# Patient Record
Sex: Female | Born: 2005 | Race: White | Hispanic: No | Marital: Single | Smoking: Never smoker
Health system: Southern US, Community
[De-identification: ages and names within clinical notes are randomized; demographics above are authoritative.]

## PROBLEM LIST (undated history)

## (undated) DIAGNOSIS — F4310 Post-traumatic stress disorder, unspecified: Secondary | ICD-10-CM

## (undated) DIAGNOSIS — G43909 Migraine, unspecified, not intractable, without status migrainosus: Secondary | ICD-10-CM

## (undated) DIAGNOSIS — F32A Depression, unspecified: Secondary | ICD-10-CM

## (undated) DIAGNOSIS — F419 Anxiety disorder, unspecified: Secondary | ICD-10-CM

## (undated) DIAGNOSIS — F909 Attention-deficit hyperactivity disorder, unspecified type: Secondary | ICD-10-CM

## (undated) HISTORY — PX: RHINOPLASTY: SHX2354

## (undated) NOTE — Progress Notes (Signed)
Formatting of this note might be different from the original.  Lyman Bishop in office today with self. Best contact number [phone]  .    During COVID-19 pandemic any testing of patient with concern for pandemic virus was performed with full PPE.     Electronically signed by Lyndal Pulley, RN at 10/21/2020  9:42 AM EDT

## (undated) NOTE — Progress Notes (Signed)
Formatting of this note is different from the original.  Chief Complaint: f/u depression    HPI:  Patient taking zoloft for past 4 weeks, did 25 mg for 2 weeks and now at 50mg  for 2 weeks.    Patch birth control stopped because nauseated with patch and zoloft  LMP 09/24/20.  Patient not sexually active currently.      Mood is described as better and patient  Patient is seeing therapist weekly and this is going well and helpful.    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   Also taking EMT course in the spring.    Patient has own horse (boards)    ROS:   Noncontributory other than noted in HPI    PE:  Temp 98.1 F (36.7 C) (Temporal)   Wt 137 lb 12.8 oz (62.5 kg)   LMP 09/30/2020 (Exact Date)    Alert, oriented  HEENT:  Clear  Lungs:  Clear  CV:  RRR without murmer  Abd:  Soft, nontender, nondistended, +BS  Ext:  FROM  Skin:  Clear  PHQ9     Depression Screen 10/21/2020 09/22/2020    Please choose the category that best describes the patient's current state 1 0    Not eligible on the basis of Not applicable Not applicable    1. Little interest or pleasure in doing things 1 2    2. Feeling down, depressed, or hopeless 0 2    PHQ-2 Total Score 1 4    PHQ-2 Interpretation of Score for Depression (Payor) Negative -    3. Trouble falling or staying asleep 0 0    4. Feeling tired or having little energy 1 3    5. Poor appetite or overeating 2 2    6. Feeling bad about yourself - or that you are a failure or have let yourself or your family down 1 3    7. Trouble concentrating on things, such as reading the newspaper or watching television 2 3    8. Moving or speaking so slowly that other people could have noticed.  Or the opposite - being so fidgety or restless that you have been moving around a lot more than usual. 1 2    9. Thoughts that you would be better off dead, or of hurting yourself in some way. 0 0    10. How difficult  have these problems made it for you to do your work, take care of things at home or get along with other people? Somewhat difficult Extremely difficult    PHQ Total Score 8 17    Interpretation: Mild Depression, repeat at follow-up Moderately Severe Depression, immediate pharmacotherapy and/or counseling recommended    PHQ-9 Interpretation of Score for Depression (Payor) Negative Positive    Type of intervention recommended: - Referral to Psychiatry/Therapy/Counseling;Counseling at today's visit;Patient prescribed or currently receiving intervention (treatment/counseling)           Assessment:  Encounter Diagnosis   Name Primary?   ? PTSD (post-traumatic stress disorder)        Plan: Depression symptoms better and will cont zoloft 50mg  and therapy.  Psychiatry number given to patient so she could f/u and make appt.  F/u depression in 3-4 months  Hold on OCP now per patient's desires.  Discussed options for future.    No orders of the defined types were placed in this encounter.  Electronically signed by Carlean Jews, MD at 10/21/2020  9:42 AM EDT

---
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
## 2023-05-19 ENCOUNTER — Emergency Department

## 2023-05-19 ENCOUNTER — Other Ambulatory Visit: Payer: Self-pay

## 2023-05-19 ENCOUNTER — Emergency Department
Admission: EM | Admit: 2023-05-19 | Discharge: 2023-05-19 | Disposition: A | Discharge status: Home / Self Care | Attending: Emergency Medicine | Admitting: Emergency Medicine

## 2023-05-19 DIAGNOSIS — M5442 Lumbago with sciatica, left side: Secondary | ICD-10-CM | POA: Insufficient documentation

## 2023-05-19 DIAGNOSIS — M25552 Pain in left hip: Secondary | ICD-10-CM | POA: Insufficient documentation

## 2023-05-19 DIAGNOSIS — M549 Dorsalgia, unspecified: Secondary | ICD-10-CM | POA: Diagnosis not present

## 2023-05-19 HISTORY — DX: Anxiety disorder, unspecified: F41.9

## 2023-05-19 HISTORY — DX: Post-traumatic stress disorder, unspecified: F43.10

## 2023-05-19 HISTORY — DX: Attention-deficit hyperactivity disorder, unspecified type: F90.9

## 2023-05-19 MED ORDER — OXYCODONE-ACETAMINOPHEN 5-325 MG PO TABS: 1.0000 | ORAL_TABLET | ORAL | 0 refills | Status: AC | PRN

## 2023-05-19 MED ORDER — CYCLOBENZAPRINE HCL 10 MG PO TABS: 10.0000 mg | ORAL_TABLET | Freq: Three times a day (TID) | ORAL | 0 refills | Status: AC | PRN

## 2023-05-19 MED ORDER — OXYCODONE-ACETAMINOPHEN 5-325 MG PO TABS
1.0000 | ORAL_TABLET | Freq: Once | ORAL | Status: AC
  Administered 2023-05-19: 1 via ORAL
  Filled 2023-05-19: qty 1

## 2023-05-19 MED ORDER — PREDNISONE 10 MG (48) PO TBPK
ORAL_TABLET | ORAL | 0 refills | Status: AC
Start: 2023-05-19 — End: ?

## 2023-05-19 NOTE — ED Triage Notes (Addendum)
 Patient arrives POV c/o left hip pain that started Friday night; patient reports going to her PCP on Saturday and was prescribed a 6-day taper of prednisone with today being the last dose. 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 Patient reports father has hx of spinal AVM.

## 2023-05-19 NOTE — Discharge Instructions (Signed)
 Use ice to lower back.  Do not use heat.  Take medication as prescribed.  The Flexeril and the oxycodone will both make you drowsy.  Do not operate machinery while using these medications.  Follow-up with orthopedics.  You may need physical therapy.  Return if worsening.

## 2023-05-19 NOTE — ED Provider Triage Note (Signed)
 Emergency Medicine Provider Triage Evaluation Note  Karen Reilly , a 18 y.o. female  was evaluated in triage.  Pt complains of left hip and lower back pain.  Is on a steroid taper which is not helping.  Patient rides horses.  Review of Systems  Positive:  Negative:   Physical Exam  BP 117/79 (BP Location: Left Arm)   Pulse 75   Temp 97.6 F (36.4 C) (Oral)   Resp 19   Ht 5\' 5"  (1.651 m)   Wt 65.8 kg   LMP 05/05/2023 (Exact Date)   SpO2 99%   BMI 24.13 kg/m  Gen:   Awake, no distress   Resp:  Normal effort  MSK:   Moves extremities without difficulty  Other:  Tender at the lumbar spine around L5 L4, tender at the left trochanter bursa  Medical Decision Making  Medically screening exam initiated at 12:42 PM.  Appropriate orders placed.  Marzell Allemand was informed that the remainder of the evaluation will be completed by another provider, this initial triage assessment does not replace that evaluation, and the importance of remaining in the ED until their evaluation is complete.     Faythe Ghee, PA-C 05/19/23 1243

## 2023-05-19 NOTE — ED Notes (Signed)
 See triage notes. Patient c/o left hip/leg pain. Patient was seen at their PCP and was prescribed medications for sciatica. Patient stated pain has been going on since last Friday.

## 2023-05-19 NOTE — ED Provider Notes (Signed)
 [facility] Provider Note    Event Date/Time   First MD Initiated Contact with Patient 05/19/23 1549     (approximate)   History   Hip Pain   HPI  Karen Reilly is a 18 y.o. female with complaints of left hip and lower back pain presents emergency department stating that she was seen by her regular doctor and given a 6-day prednisone taper.  Was feeling better on the first 2 days but pain has returned.  Radiates down the left leg.  Patient does ride horses and works with animals.  Has not had a fall or injury that she knows of.  Patient last rode the horse yesterday.  Father has history of spinal AVM      Physical Exam   Triage Vital Signs: ED Triage Vitals  Encounter Vitals Group     BP 05/19/23 1241 117/79     Systolic BP Percentile --      Diastolic BP Percentile --      Pulse Rate 05/19/23 1241 75     Resp 05/19/23 1241 19     Temp 05/19/23 1241 97.6 F (36.4 C)     Temp Source 05/19/23 1241 Oral     SpO2 05/19/23 1241 99 %     Weight 05/19/23 1234 145 lb (65.8 kg)     Height 05/19/23 1234 5\' 5"  (1.651 m)     Head Circumference --      Peak Flow --      Pain Score 05/19/23 1232 7     Pain Loc --      Pain Education --      Exclude from Growth Chart --     Most recent vital signs: Vitals:   05/19/23 1241  BP: 117/79  Pulse: 75  Resp: 19  Temp: 97.6 F (36.4 C)  SpO2: 99%     General: Awake, no distress.   CV:  Good peripheral perfusion. regular rate and  rhythm Resp:  Normal effort.  Abd:  No distention.   Other:  Lumbar spine tender to palpation, left hip tender along the trochanteric bursa, full range of motion, patient is able to walk slowly but without difficulty, no foot drop noted   ED Results / Procedures / Treatments   Labs (all labs ordered are listed, but only abnormal results are displayed) Labs Reviewed - No data to display   EKG     RADIOLOGY X-ray lumbar spine x-ray left  hip    PROCEDURES:   Procedures Chief Complaint  Patient presents with   Hip Pain      MEDICATIONS ORDERED IN ED: Medications  oxyCODONE-acetaminophen (PERCOCET/ROXICET) 5-325 MG per tablet 1 tablet (1 tablet Oral Given 05/19/23 1616)     IMPRESSION / MDM / ASSESSMENT AND PLAN / ED COURSE  I reviewed the triage vital signs and the nursing notes.                              Differential diagnosis includes, but is not limited to, fracture, contusion, sprain  Patient's presentation is most consistent with acute illness / injury with system symptoms.   X-ray of lumbar spine and left hip independently reviewed and interpreted by me as being negative for any acute abnormality.    Patient was given Percocet p.o. here in the ED.  To be given a 12-day steroid taper.  Flexeril muscle relaxer.  I did instruct her not to  ride horses until evaluated by orthopedics.  Since the pain radiates to both hips I was concerned about her back.  Explained to her she may end up needing physical therapy etc.  She is in agreement treatment plan.  Discharged stable condition in the care of her family member      FINAL CLINICAL IMPRESSION(S) / ED DIAGNOSES   Final diagnoses:  Left hip pain  Acute midline low back pain with left-sided sciatica     Rx / DC Orders   ED Discharge Orders          Ordered    predniSONE (STERAPRED UNI-PAK 48 TAB) 10 MG (48) TBPK tablet        05/19/23 1608    cyclobenzaprine (FLEXERIL) 10 MG tablet  3 times daily PRN        05/19/23 1608    oxyCODONE-acetaminophen (PERCOCET) 5-325 MG tablet  Every 4 hours PRN        05/19/23 1608             Note:  This document was prepared using Dragon voice recognition software and may include unintentional dictation errors.    Faythe Ghee, PA-C 05/19/23 1827    Claybon Jabs, MD 05/19/23 [phone]

## 2023-05-20 DIAGNOSIS — M5416 Radiculopathy, lumbar region: Secondary | ICD-10-CM | POA: Diagnosis not present

## 2023-05-23 DIAGNOSIS — M9902 Segmental and somatic dysfunction of thoracic region: Secondary | ICD-10-CM | POA: Diagnosis not present

## 2023-05-23 DIAGNOSIS — M9901 Segmental and somatic dysfunction of cervical region: Secondary | ICD-10-CM | POA: Diagnosis not present

## 2023-05-23 DIAGNOSIS — M5414 Radiculopathy, thoracic region: Secondary | ICD-10-CM | POA: Diagnosis not present

## 2023-05-23 DIAGNOSIS — R519 Headache, unspecified: Secondary | ICD-10-CM | POA: Diagnosis not present

## 2023-05-24 ENCOUNTER — Other Ambulatory Visit: Payer: Self-pay | Admitting: Emergency Medicine

## 2023-05-24 DIAGNOSIS — M549 Dorsalgia, unspecified: Secondary | ICD-10-CM

## 2023-05-26 ENCOUNTER — Encounter: Payer: Self-pay | Admitting: Emergency Medicine

## 2023-05-27 DIAGNOSIS — M9901 Segmental and somatic dysfunction of cervical region: Secondary | ICD-10-CM | POA: Diagnosis not present

## 2023-05-27 DIAGNOSIS — M5414 Radiculopathy, thoracic region: Secondary | ICD-10-CM | POA: Diagnosis not present

## 2023-05-27 DIAGNOSIS — M9902 Segmental and somatic dysfunction of thoracic region: Secondary | ICD-10-CM | POA: Diagnosis not present

## 2023-05-27 DIAGNOSIS — R519 Headache, unspecified: Secondary | ICD-10-CM | POA: Diagnosis not present

## 2023-05-28 ENCOUNTER — Other Ambulatory Visit

## 2023-06-06 ENCOUNTER — Other Ambulatory Visit: Payer: Self-pay

## 2023-06-06 ENCOUNTER — Emergency Department
Admission: EM | Admit: 2023-06-06 | Discharge: 2023-06-07 | Disposition: A | Discharge status: Home / Self Care | Attending: Emergency Medicine | Admitting: Emergency Medicine

## 2023-06-06 DIAGNOSIS — R112 Nausea with vomiting, unspecified: Secondary | ICD-10-CM | POA: Diagnosis not present

## 2023-06-06 DIAGNOSIS — R110 Nausea: Secondary | ICD-10-CM | POA: Diagnosis not present

## 2023-06-06 DIAGNOSIS — E871 Hypo-osmolality and hyponatremia: Secondary | ICD-10-CM | POA: Diagnosis not present

## 2023-06-06 DIAGNOSIS — R519 Headache, unspecified: Secondary | ICD-10-CM | POA: Diagnosis not present

## 2023-06-06 LAB — URINALYSIS, ROUTINE W REFLEX MICROSCOPIC
Bilirubin Urine: NEGATIVE
Glucose, UA: NEGATIVE mg/dL
Hgb urine dipstick: NEGATIVE
Ketones, ur: NEGATIVE mg/dL
Leukocytes,Ua: NEGATIVE
Nitrite: NEGATIVE
Protein, ur: NEGATIVE mg/dL
Specific Gravity, Urine: 1.016 (ref 1.005–1.030)
pH: 6 (ref 5.0–8.0)

## 2023-06-06 LAB — CBC WITH DIFFERENTIAL/PLATELET
Abs Immature Granulocytes: 0.04 10*3/uL (ref 0.00–0.07)
Basophils Absolute: 0.1 10*3/uL (ref 0.0–0.1)
Basophils Relative: 1 %
Eosinophils Absolute: 0.2 10*3/uL (ref 0.0–0.5)
Eosinophils Relative: 2 %
HCT: 39.4 % (ref 36.0–46.0)
Hemoglobin: 13.6 g/dL (ref 12.0–15.0)
Immature Granulocytes: 0 %
Lymphocytes Relative: 19 %
Lymphs Abs: 2 10*3/uL (ref 0.7–4.0)
MCH: 30.7 pg (ref 26.0–34.0)
MCHC: 34.5 g/dL (ref 30.0–36.0)
MCV: 88.9 fL (ref 80.0–100.0)
Monocytes Absolute: 0.7 10*3/uL (ref 0.1–1.0)
Monocytes Relative: 7 %
Neutro Abs: 7.3 10*3/uL (ref 1.7–7.7)
Neutrophils Relative %: 71 %
Platelets: 319 10*3/uL (ref 150–400)
RBC: 4.43 MIL/uL (ref 3.87–5.11)
RDW: 12.2 % (ref 11.5–15.5)
WBC: 10.3 10*3/uL (ref 4.0–10.5)
nRBC: 0 % (ref 0.0–0.2)

## 2023-06-06 LAB — BASIC METABOLIC PANEL WITH GFR
Anion gap: 11 (ref 5–15)
BUN: 12 mg/dL (ref 6–20)
CO2: 23 mmol/L (ref 22–32)
Calcium: 9 mg/dL (ref 8.9–10.3)
Chloride: 99 mmol/L (ref 98–111)
Creatinine, Ser: 0.82 mg/dL (ref 0.44–1.00)
GFR, Estimated: >60 mL/min (ref >60)
Glucose, Bld: 105 mg/dL — ABNORMAL HIGH (ref 70–99)
Potassium: 3.6 mmol/L (ref 3.5–5.1)
Sodium: 133 mmol/L — ABNORMAL LOW (ref 135–145)

## 2023-06-06 LAB — POC URINE PREG, ED: Preg Test, Ur: NEGATIVE

## 2023-06-06 MED ORDER — ONDANSETRON 4 MG PO TBDP: 4.0000 mg | ORAL_TABLET | Freq: Three times a day (TID) | ORAL | 0 refills | Status: AC | PRN

## 2023-06-06 MED ORDER — KETOROLAC TROMETHAMINE 30 MG/ML IJ SOLN
30.0000 mg | Freq: Once | INTRAMUSCULAR | Status: AC
  Administered 2023-06-07: 30 mg via INTRAVENOUS
  Filled 2023-06-06: qty 1

## 2023-06-06 MED ORDER — ONDANSETRON HCL 4 MG/2ML IJ SOLN
4.0000 mg | Freq: Once | INTRAMUSCULAR | Status: AC
  Administered 2023-06-06: 4 mg via INTRAVENOUS
  Filled 2023-06-06: qty 2

## 2023-06-06 MED ORDER — SODIUM CHLORIDE 0.9 % IV BOLUS
500.0000 mL | Freq: Once | INTRAVENOUS | Status: AC
  Administered 2023-06-06: 500 mL via INTRAVENOUS

## 2023-06-06 MED ORDER — LIDOCAINE-EPINEPHRINE-TETRACAINE (LET) TOPICAL GEL
3.0000 mL | Freq: Once | TOPICAL | Status: AC
  Administered 2023-06-06: 3 mL via TOPICAL
  Filled 2023-06-06: qty 3

## 2023-06-06 NOTE — ED Triage Notes (Signed)
 Pt states she developed a headache earlier today and developed some nausea. Pt denies hx of headaches. Denies numbness or weakness in extremities, speech clear no vision changes.

## 2023-06-06 NOTE — ED Notes (Signed)
 First Nurse Note: Pt arrives via [facility] from work with complaints of headache x 2 hours with associated nausea. Took Tylenol  without relief.   VS with EMS 85 100% RA 122/67 CBG-103 98.2 temp

## 2023-06-06 NOTE — Discharge Instructions (Addendum)
 You have been diagnosed with mild hyponatremia, headache and nauseas.  Please take ondansetron  20 minutes before main meals.  You can take acetaminophen  for headache every 6 hours.  Please call neurology and make an appointment tomorrow.  Come back to ED or go to your PCP if you have new symptoms symptoms worsen.  Please drink plenty of fluids, please drink electrolytes like liquid IV.

## 2023-06-06 NOTE — ED Provider Notes (Signed)
 [facility] Provider Note    Event Date/Time   First MD Initiated Contact with Patient 06/06/23 2151     (approximate)   History   Headache    HPI  Karen Reilly is a 18 y.o. female   with no significant past medical history who presents to the ED complaining of nauseas, headache,    According to the patient she was seen 3 weeks ago after being unable to walk.  PCP ordered in ivermectin, and she was able to walk again.  Patient denies fever, chills, cough, nasal congestion, urinary symptoms.       Physical Exam   Triage Vital Signs: ED Triage Vitals  Encounter Vitals Group     BP 06/06/23 1921 126/83     Systolic BP Percentile --      Diastolic BP Percentile --      Pulse Rate 06/06/23 1921 84     Resp 06/06/23 1921 18     Temp 06/06/23 1921 98.5 F (36.9 C)     Temp src --      SpO2 06/06/23 1921 100 %     Weight 06/06/23 1922 140 lb (63.5 kg)     Height 06/06/23 1922 5\' 5"  (1.651 m)     Head Circumference --      Peak Flow --      Pain Score 06/06/23 1922 7     Pain Loc --      Pain Education --      Exclude from Growth Chart --     Most recent vital signs: Vitals:   06/06/23 1921  BP: 126/83  Pulse: 84  Resp: 18  Temp: 98.5 F (36.9 C)  SpO2: 100%     Constitutional: Alert, NAD. Able to speak in complete sentences without cough or dyspnea  Eyes: Conjunctivae are normal.  Head: Atraumatic. Nose: No congestion/rhinnorhea. Mouth/Throat: Mucous membranes are moist.   Neck: Painless ROM. Supple. No JVD, nodes, thyromegaly.  Patient states neck pain when asked to touch her thorax with her chin Cardiovascular:   Good peripheral circulation.RRR no murmurs, gallops, rubs  Respiratory: Normal respiratory effort.  No retractions. Clear to auscultation bilaterally without wheezing or crackles  Gastrointestinal: Soft and nontender.  Musculoskeletal:  no deformity Neurologic:  MAE spontaneously. No gross focal neurologic deficits are  appreciated.  Babinski negative Skin:  Skin is warm, dry and intact. No rash noted. Psychiatric: Mood and affect are normal. Speech and behavior are normal.    ED Results / Procedures / Treatments   Labs (all labs ordered are listed, but only abnormal results are displayed) Labs Reviewed  BASIC METABOLIC PANEL WITH GFR - Abnormal; Notable for the following components:      Result Value   Sodium 133 (*)    Glucose, Bld 105 (*)    All other components within normal limits  URINALYSIS, ROUTINE W REFLEX MICROSCOPIC - Abnormal; Notable for the following components:   Color, Urine YELLOW (*)    APPearance HAZY (*)    All other components within normal limits  POC URINE PREG, ED - Normal  CBC WITH DIFFERENTIAL/PLATELET     EKG     RADIOLOGY    PROCEDURES:  Critical Care performed:   Procedures   MEDICATIONS ORDERED IN ED: Medications  sodium chloride  0.9 % bolus 500 mL (has no administration in time range)  lidocaine -EPINEPHrine -tetracaine  (LET) topical gel (has no administration in time range)  ketorolac  (TORADOL ) 30 MG/ML injection 30 mg (has no  administration in time range)  ondansetron  (ZOFRAN ) injection 4 mg (4 mg Intravenous Given 06/06/23 2340)   Clinical Course as of 06/06/23 2341  Mon Jun 06, 2023  2224 Urinalysis, Routine w reflex microscopic -Urine, Clean Catch(!) Within normal limits [AE]  2224 Basic metabolic panel(!) Mild hyponatremia, renal function within normal limits.  [AE]  2225 CBC with Differential White blood cells hemoglobin and platelets within normal limits [AE]  2225 POC Urine Pregnancy, ED Negative [AE]    Clinical Course User Index [AE] Awilda Lennox, PA-C    IMPRESSION / MDM / ASSESSMENT AND PLAN / ED COURSE  I reviewed the triage vital signs and the nursing notes.  Differential diagnosis includes, but is not limited to, dehydration, viral infection, hyponatremia, UTI, pregnancy  Patient's presentation is most consistent with  acute complicated illness / injury requiring diagnostic workup.   Patient's diagnosis is consistent with hyponatremia.  Labs are  reassuring ruling out pregnancy, UTI. I did review the patient's allergies and medications.During admission patient received 0.9% saline solution 500 mL, Zofran , Toradol .  The patient is in stable and satisfactory condition for discharge home  Patient will be discharged home with prescriptions for ondansetron .  I advised patients to take oral electrolytes. Patient is to follow up with neurology as needed or otherwise directed. Patient is given ED precautions to return to the ED for any worsening or new symptoms. Discussed plan of care with patient, answered all of patient's questions, Patient agreeable to plan of care. Advised patient to take medications according to the instructions on the label. Discussed possible side effects of new medications. Patient verbalized understanding.    FINAL CLINICAL IMPRESSION(S) / ED DIAGNOSES   Final diagnoses:  Bad headache  Hyponatremia  Nausea     Rx / DC Orders   ED Discharge Orders          Ordered    ondansetron  (ZOFRAN -ODT) 4 MG disintegrating tablet  Every 8 hours PRN        06/06/23 2336             Note:  This document was prepared using Dragon voice recognition software and may include unintentional dictation errors.   Awilda Lennox, PA-C 06/06/23 2341    Twilla Galea, MD 06/09/23 [phone]

## 2023-06-06 NOTE — ED Notes (Signed)
 Pt states that she is having a severe headache describes same as a" ruber band wrapped around her head". Pt states she has a hx of migraines but same has not happed in many years and this pain feels different.

## 2023-06-09 DIAGNOSIS — M5416 Radiculopathy, lumbar region: Secondary | ICD-10-CM | POA: Diagnosis not present

## 2023-06-14 ENCOUNTER — Encounter: Payer: Self-pay | Admitting: Neurology

## 2023-06-14 ENCOUNTER — Telehealth: Payer: Self-pay | Admitting: Neurology

## 2023-06-14 ENCOUNTER — Ambulatory Visit: Admitting: Neurology

## 2023-06-14 NOTE — Telephone Encounter (Signed)
 NS for new pt visit.

## 2023-06-21 DIAGNOSIS — M5416 Radiculopathy, lumbar region: Secondary | ICD-10-CM | POA: Diagnosis not present

## 2023-06-23 DIAGNOSIS — M5416 Radiculopathy, lumbar region: Secondary | ICD-10-CM | POA: Diagnosis not present

## 2023-06-29 DIAGNOSIS — M5416 Radiculopathy, lumbar region: Secondary | ICD-10-CM | POA: Diagnosis not present

## 2023-06-30 ENCOUNTER — Institutional Professional Consult (permissible substitution): Admitting: Neurology

## 2023-06-30 ENCOUNTER — Telehealth: Payer: Self-pay | Admitting: Neurology

## 2023-06-30 NOTE — Telephone Encounter (Signed)
 [facility] cancellation

## 2023-07-04 IMAGING — CR DG FOOT COMPLETE 3+V*L*
1 series · 3 of 3 positions shown · non-contrast
Comparison: None.

CLINICAL DATA: Left foot pain.

EXAM:
LEFT FOOT - COMPLETE 3+ VIEW; LEFT ANKLE COMPLETE - 3+ VIEW

[Series 1: dg foot complete left · 0.14mm/px · 3 of 3 slices shown]
[im 1/3]
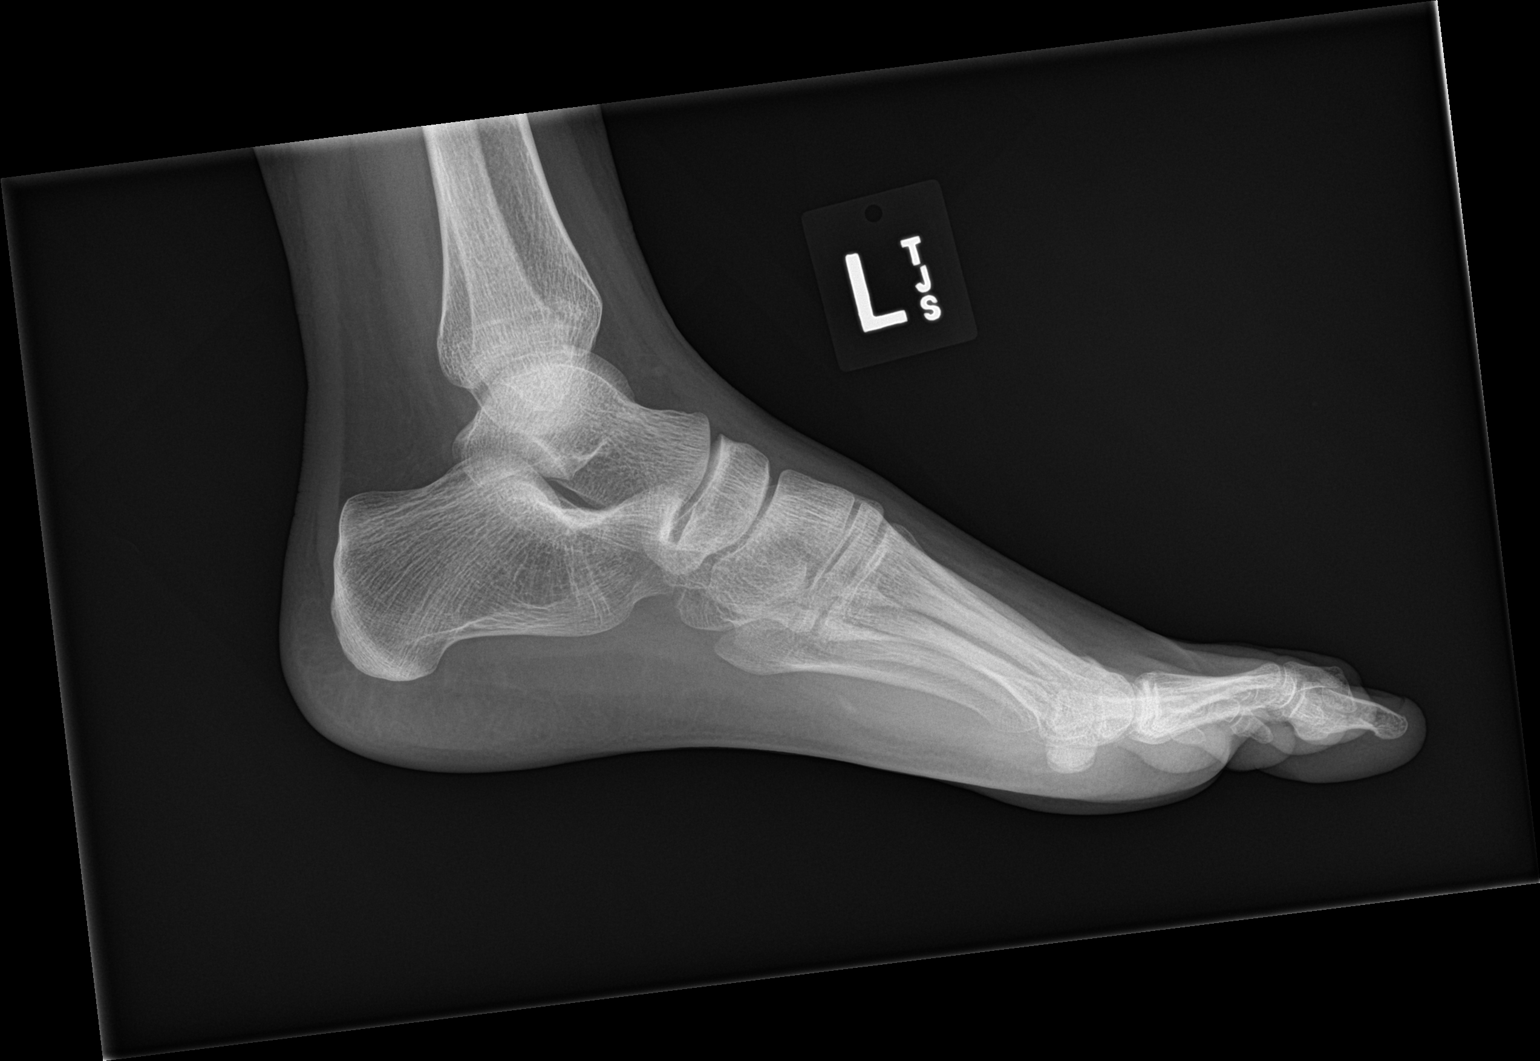
[im 2/3]
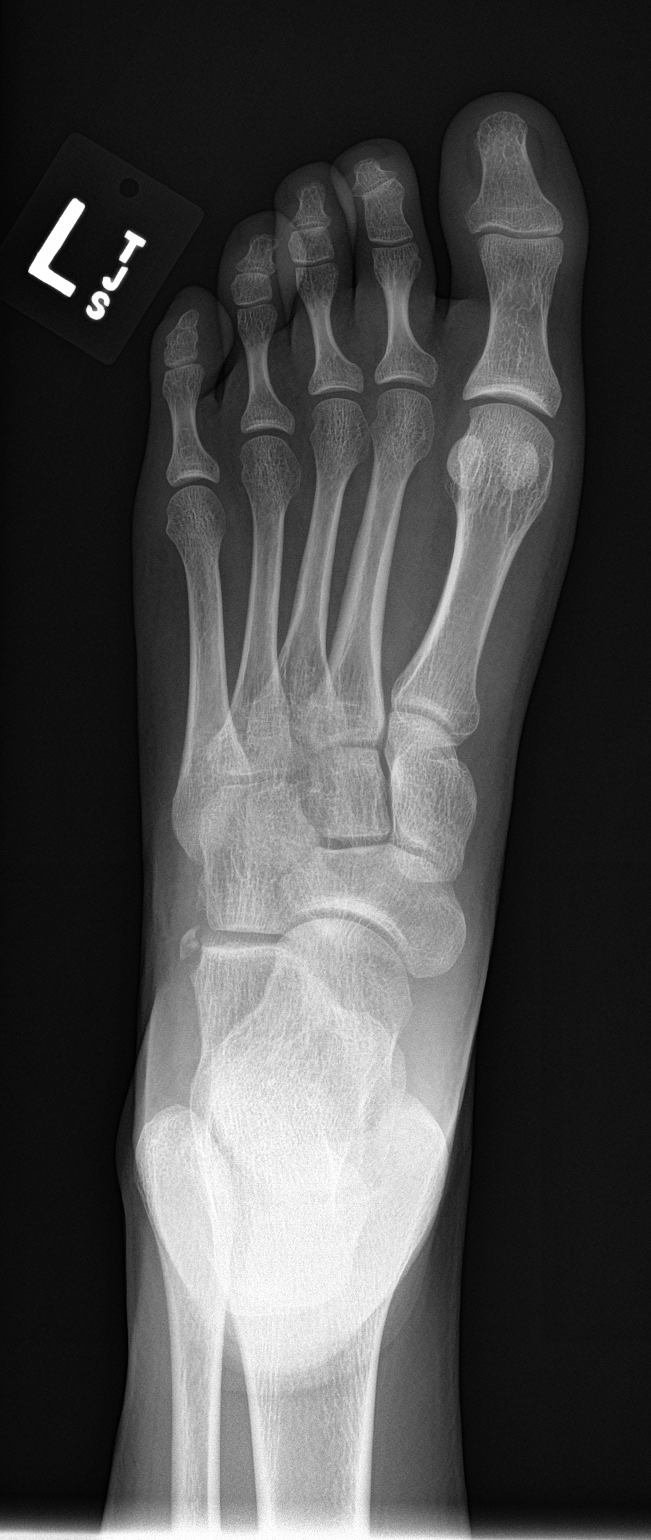
[im 3/3]
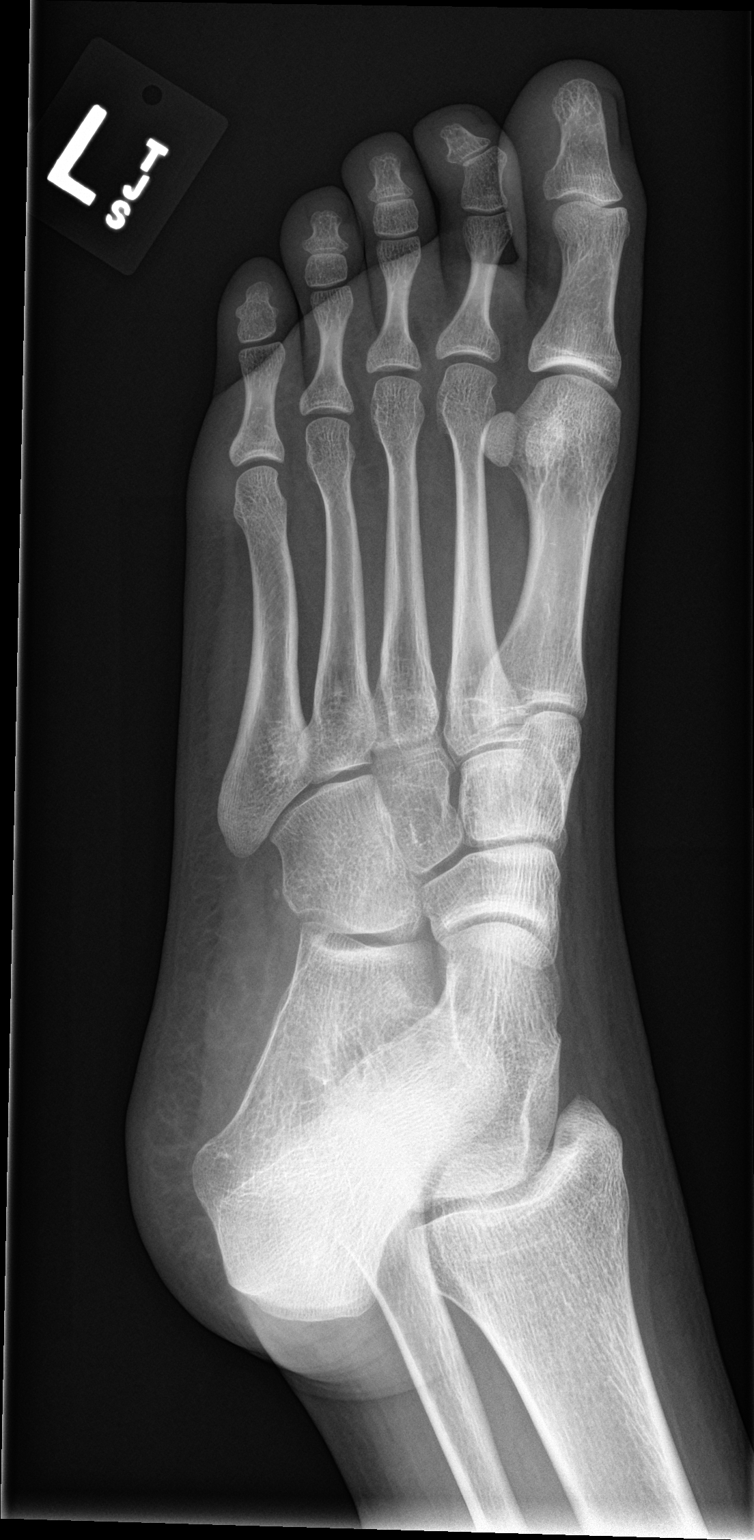

[3 of 3 positions shown; findings below may reference images not displayed]

FINDINGS: There is a minimally displaced avulsion fracture of the lateral
aspect of the anterior calcaneus. No other acute fracture
identified. The bones are well mineralized. No arthritic changes.
The soft tissues are unremarkable.
IMPRESSION: Minimally displaced avulsion fracture of the anterior calcaneus.

## 2023-07-04 IMAGING — CR DG ANKLE COMPLETE 3+V*L*
1 series · 3 of 3 positions shown · non-contrast
Comparison: None.

CLINICAL DATA: Left foot pain.

EXAM:
LEFT FOOT - COMPLETE 3+ VIEW; LEFT ANKLE COMPLETE - 3+ VIEW

[Series 1: dg ankle complete left · 0.14mm/px · 3 of 3 slices shown]
[im 1/3]
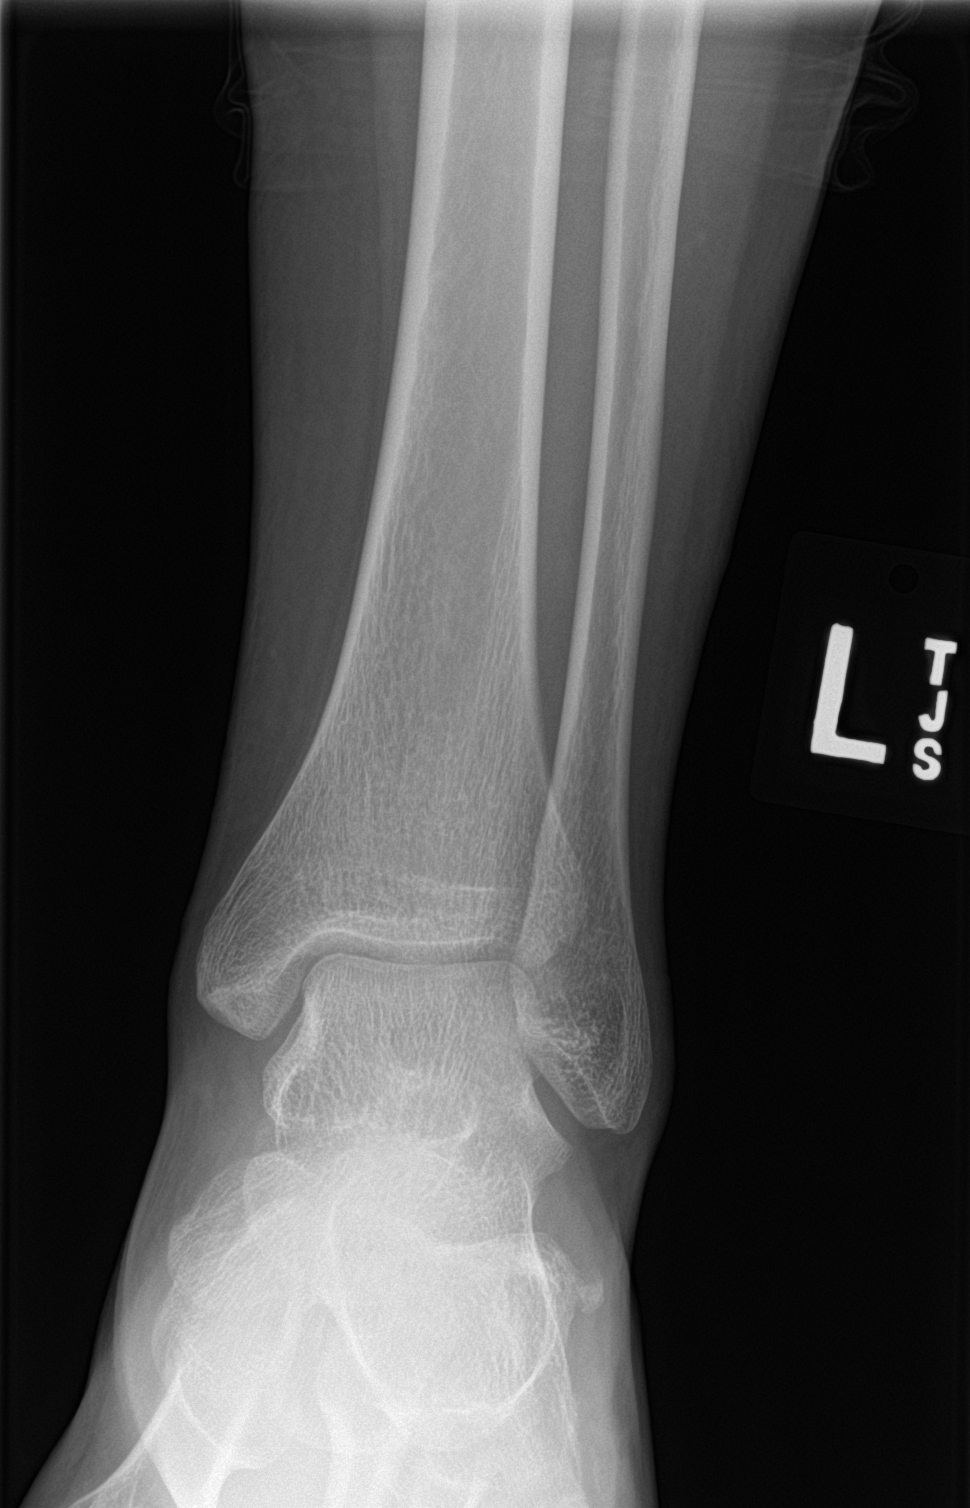
[im 2/3]
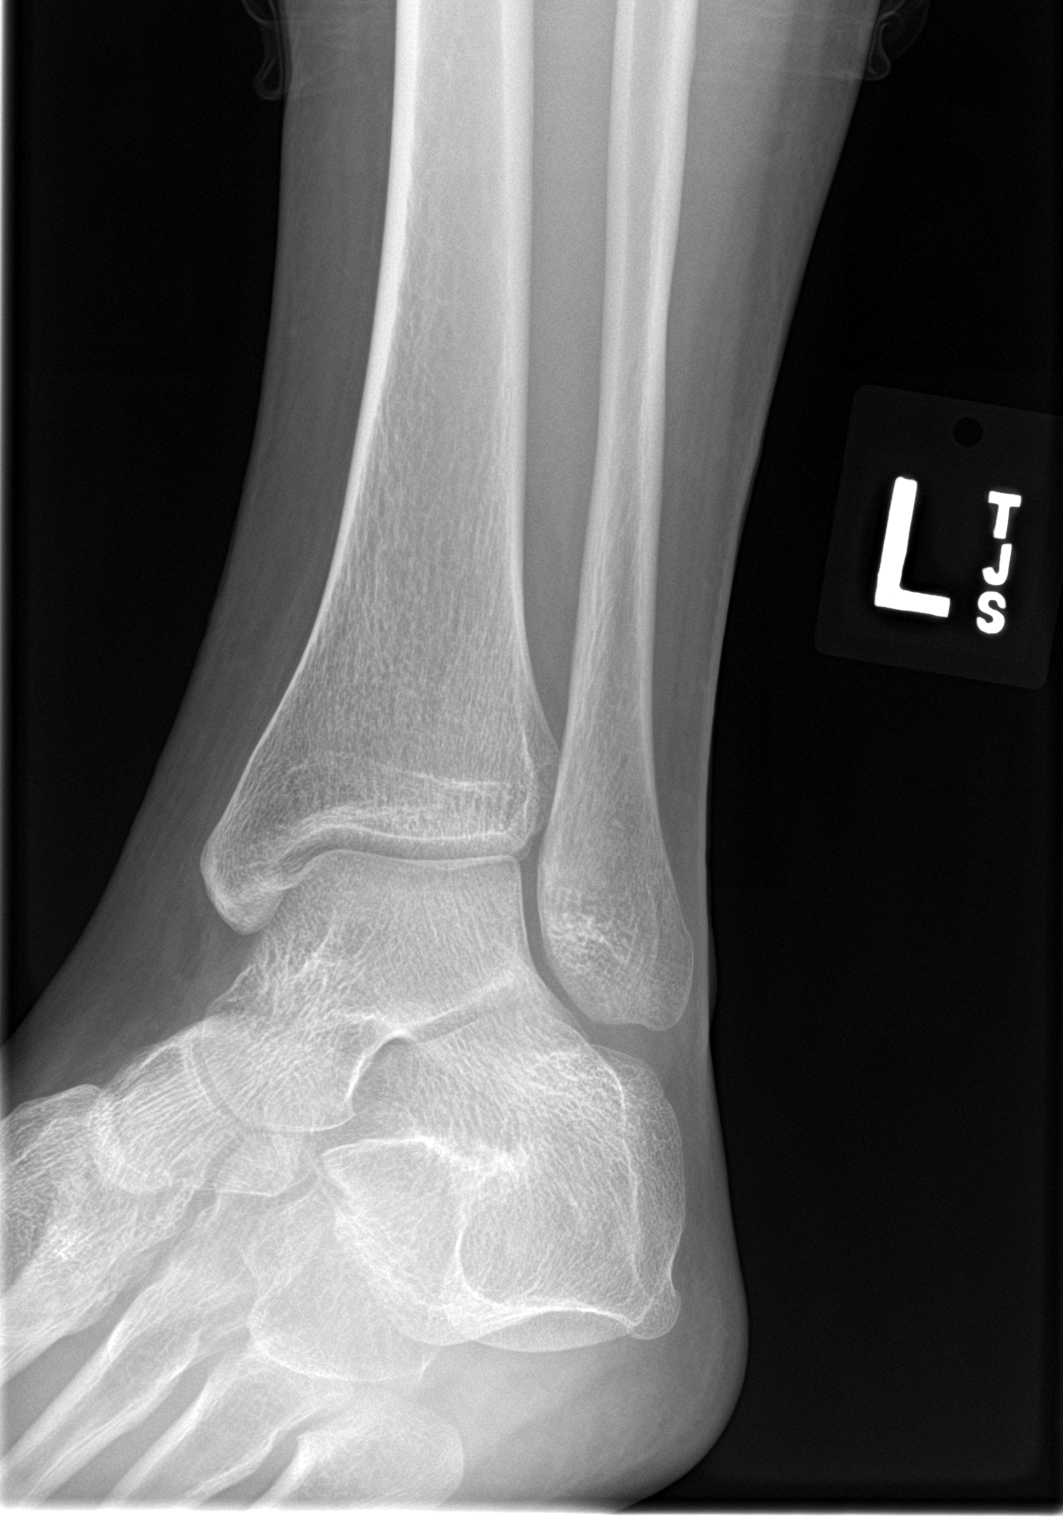
[im 3/3]
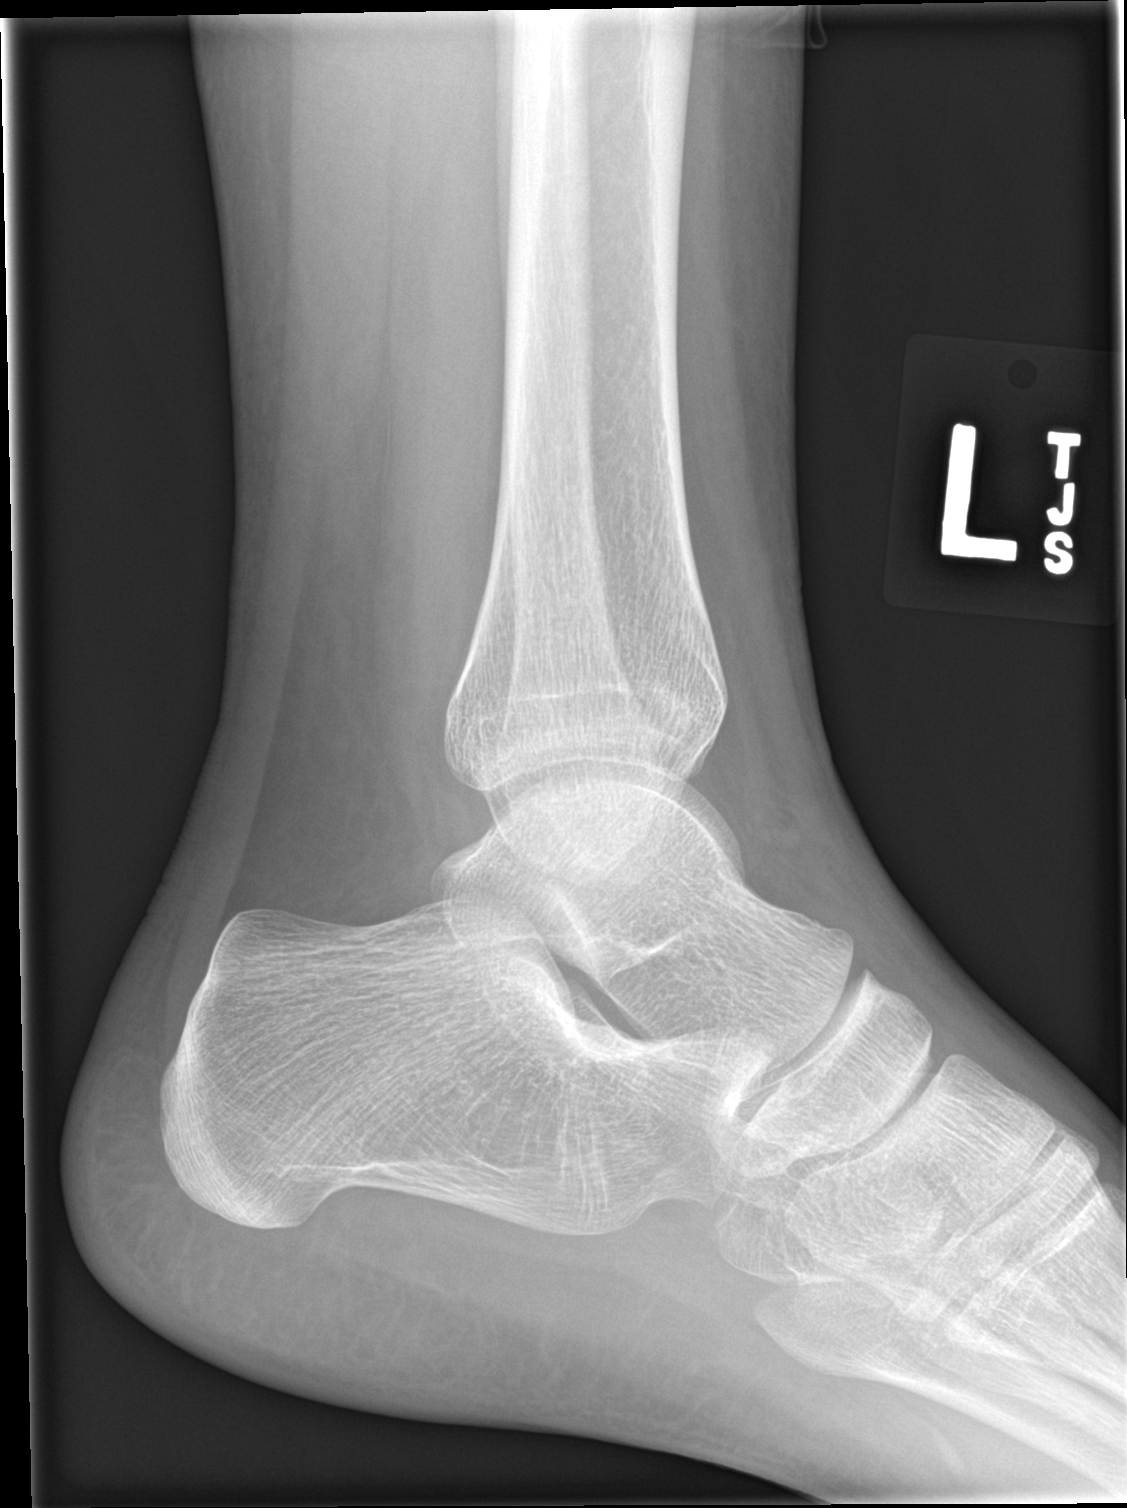

[3 of 3 positions shown; findings below may reference images not displayed]

FINDINGS: There is a minimally displaced avulsion fracture of the lateral
aspect of the anterior calcaneus. No other acute fracture
identified. The bones are well mineralized. No arthritic changes.
The soft tissues are unremarkable.
IMPRESSION: Minimally displaced avulsion fracture of the anterior calcaneus.

## 2023-07-06 DIAGNOSIS — M5416 Radiculopathy, lumbar region: Secondary | ICD-10-CM | POA: Diagnosis not present

## 2023-07-12 DIAGNOSIS — M5416 Radiculopathy, lumbar region: Secondary | ICD-10-CM | POA: Diagnosis not present

## 2023-09-09 DIAGNOSIS — M545 Low back pain, unspecified: Secondary | ICD-10-CM | POA: Diagnosis not present

## 2023-09-09 DIAGNOSIS — M25552 Pain in left hip: Secondary | ICD-10-CM | POA: Diagnosis not present

## 2023-09-16 DIAGNOSIS — M5416 Radiculopathy, lumbar region: Secondary | ICD-10-CM | POA: Diagnosis not present

## 2023-10-04 ENCOUNTER — Telehealth: Payer: Self-pay | Admitting: Neurology

## 2023-10-04 NOTE — Telephone Encounter (Signed)
Mychart Reschedule

## 2023-10-06 ENCOUNTER — Ambulatory Visit: Admitting: Neurology

## 2023-10-21 DIAGNOSIS — M461 Sacroiliitis, not elsewhere classified: Secondary | ICD-10-CM | POA: Diagnosis not present

## 2023-11-09 DIAGNOSIS — M461 Sacroiliitis, not elsewhere classified: Secondary | ICD-10-CM | POA: Diagnosis not present

## 2023-12-14 DIAGNOSIS — M461 Sacroiliitis, not elsewhere classified: Secondary | ICD-10-CM | POA: Diagnosis not present

## 2024-01-25 ENCOUNTER — Encounter: Payer: Self-pay | Admitting: Neurology

## 2024-01-25 ENCOUNTER — Ambulatory Visit: Admitting: Neurology

## 2024-01-25 VITALS — BP 128/53 | HR 75 | Ht 65.0 in | Wt 149.0 lb

## 2024-01-25 DIAGNOSIS — Z820 Family history of epilepsy and other diseases of the nervous system: Secondary | ICD-10-CM

## 2024-01-25 DIAGNOSIS — G47411 Narcolepsy with cataplexy: Secondary | ICD-10-CM

## 2024-01-25 DIAGNOSIS — R400 Somnolence: Secondary | ICD-10-CM

## 2024-01-25 DIAGNOSIS — G4710 Hypersomnia, unspecified: Secondary | ICD-10-CM

## 2024-01-25 DIAGNOSIS — R519 Headache, unspecified: Secondary | ICD-10-CM | POA: Diagnosis not present
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
## 2024-01-25 NOTE — Patient Instructions (Addendum)
°  Thank you for choosing [facility] for your sleep related care!  Here is what we discussed today and what we came up with as our plan for you:   Please do not drive when feeling sleepy.  You are at risk of falling asleep at the wheel!  Based on your sleep related symptoms, we should look into your severe sleepiness with a nighttime lab-attended sleep study, followed by a daytime nap study.  During the overnight sleep study we will monitor your night time sleep and we will do nap study testing the next day: 5 scheduled 20 min nap opportunities, every 2 hours. We will remind you to stay awake in between naps.    In preparation for sleep study testing, as discussed, you would have to taper off or stay off of certain medications including narcotic pain medication such as oxycodone , Flexeril  which is a muscle relaxant, and take no antianxiety or depression medication which you no longer take at this point.  You would have to taper off your methylphenidate long-acting and stay off of this medication for at least 2 weeks prior to testing.    We will go ahead and seek insurance authorization for testing.   Please keep your sleep schedule stable and do not add any additional medications or caffeine in your day to day routine, in preparation of the studies.  Please limit your caffeine to 1 or 2 servings per day.   As a routine protocol we check your urine for residual medications/drugs at the time of sleep testing. Our sleep lab administrative assistant will call you to schedule your sleep study. If you don't hear back from her by about 2 weeks from now, please feel free to call her at [phone]. You can leave a message with your phone number and concerns, if you get the voicemail box. She will call back as soon as possible.

## 2024-02-20 ENCOUNTER — Ambulatory Visit: Admitting: Neurology

## 2024-02-22 ENCOUNTER — Telehealth: Payer: Self-pay | Admitting: Neurology

## 2024-02-22 NOTE — Telephone Encounter (Signed)
 Sent [employer] new insurance on file.
